# Patient Record
Sex: Female | Born: 1975 | Race: White | Hispanic: No | Marital: Single | Smoking: Current every day smoker
Health system: Southern US, Community
[De-identification: ages and names within clinical notes are randomized; demographics above are authoritative.]

## PROBLEM LIST (undated history)

## (undated) HISTORY — PX: TUBAL LIGATION: SHX77

## (undated) HISTORY — PX: CHOLECYSTECTOMY: SHX55

---

## 2015-01-31 ENCOUNTER — Emergency Department (INDEPENDENT_AMBULATORY_CARE_PROVIDER_SITE_OTHER)
Admission: EM | Admit: 2015-01-31 | Discharge: 2015-01-31 | Disposition: A | Discharge status: Home / Self Care | Payer: Self-pay | Source: Home / Self Care | Attending: Family Medicine | Admitting: Family Medicine

## 2015-01-31 ENCOUNTER — Emergency Department (INDEPENDENT_AMBULATORY_CARE_PROVIDER_SITE_OTHER): Payer: Self-pay

## 2015-01-31 ENCOUNTER — Encounter: Payer: Self-pay | Admitting: *Deleted

## 2015-01-31 DIAGNOSIS — R112 Nausea with vomiting, unspecified: Secondary | ICD-10-CM

## 2015-01-31 DIAGNOSIS — R1032 Left lower quadrant pain: Secondary | ICD-10-CM

## 2015-01-31 DIAGNOSIS — K219 Gastro-esophageal reflux disease without esophagitis: Secondary | ICD-10-CM

## 2015-01-31 LAB — POCT CBC W AUTO DIFF (K'VILLE URGENT CARE)

## 2015-01-31 LAB — POCT URINALYSIS DIP (MANUAL ENTRY)
Bilirubin, UA: NEGATIVE
Glucose, UA: NEGATIVE
Ketones, POC UA: NEGATIVE
Leukocytes, UA: NEGATIVE
Nitrite, UA: NEGATIVE
Protein Ur, POC: NEGATIVE
Spec Grav, UA: 1.015
Urobilinogen, UA: 0.2
pH, UA: 8

## 2015-01-31 LAB — COMPLETE METABOLIC PANEL WITH GFR
ALT: 19 U/L (ref 6–29)
AST: 17 U/L (ref 10–30)
Albumin: 4.3 g/dL (ref 3.6–5.1)
Alkaline Phosphatase: 61 U/L (ref 33–115)
BUN: 10 mg/dL (ref 7–25)
CO2: 25 mmol/L (ref 20–31)
Calcium: 9.5 mg/dL (ref 8.6–10.2)
Chloride: 104 mmol/L (ref 98–110)
Creat: 0.81 mg/dL (ref 0.50–1.10)
GFR, Est African American: >89 mL/min (ref >=60)
GFR, Est Non African American: >89 mL/min (ref >=60)
Glucose, Bld: 80 mg/dL (ref 65–99)
Potassium: 4.4 mmol/L (ref 3.5–5.3)
Sodium: 137 mmol/L (ref 135–146)
Total Bilirubin: 0.4 mg/dL (ref 0.2–1.2)
Total Protein: 7.1 g/dL (ref 6.1–8.1)

## 2015-01-31 LAB — POCT URINE PREGNANCY: Preg Test, Ur: NEGATIVE

## 2015-01-31 MED ORDER — ONDANSETRON 4 MG PO TBDP
4.0000 mg | ORAL_TABLET | Freq: Once | ORAL | Status: AC
  Administered 2015-01-31: 4 mg via ORAL

## 2015-01-31 MED ORDER — HYDROCODONE-ACETAMINOPHEN 5-325 MG PO TABS
1.0000 | ORAL_TABLET | Freq: Four times a day (QID) | ORAL | Status: AC | PRN
Start: 2015-01-31 — End: ?

## 2015-01-31 MED ORDER — KETOROLAC TROMETHAMINE 60 MG/2ML IM SOLN
60.0000 mg | Freq: Once | INTRAMUSCULAR | Status: AC
  Administered 2015-01-31: 60 mg via INTRAMUSCULAR

## 2015-01-31 MED ORDER — ONDANSETRON 4 MG PO TBDP: ORAL_TABLET | ORAL | Status: AC

## 2015-01-31 MED ORDER — AMOXICILLIN-POT CLAVULANATE 875-125 MG PO TABS: 1.0000 | ORAL_TABLET | Freq: Two times a day (BID) | ORAL | Status: AC

## 2015-01-31 MED ORDER — OMEPRAZOLE 20 MG PO CPDR: 20.0000 mg | DELAYED_RELEASE_CAPSULE | Freq: Every day | ORAL | Status: AC

## 2015-01-31 NOTE — ED Provider Notes (Signed)
CSN: [id number]     Arrival date & time 01/31/15  0919 History   First MD Initiated Contact with Patient 01/31/15 [phone]     Chief Complaint  Patient presents with  . Abdominal Pain   (Consider location/radiation/quality/duration/timing/severity/associated sxs/prior Treatment) HPI  Pt is a 39yo female presenting to [facility] with c/o LLQ abdominal pain that started last week.  Pain is sharp in nature. It was intermittent but within the last few days it has become more constant.  Pain is dull then sharp at times, 8/10 at worst. Pain is worse with movement.  Associated nausea and vomiting. Pt states the vomit is yellow and foam like "looks like mucous."  Denies change in bowel movements. Denies urinary or vaginal symptoms. Denies fever or chills. Pt reports having her gallbladder removed but she still has her appendix.  Pt states it also feel like she has been having worsening acid reflux after vomiting.  She is not on any antiacid medication.    History reviewed. No pertinent past medical history. Past Surgical History  Procedure Laterality Date  . Cholecystectomy    . Tubal ligation     Family History  Problem Relation Age of Onset  . COPD Mother   . Emphysema Mother   . COPD Father   . Emphysema Father    Social History  Substance Use Topics  . Smoking status: Current Every Day Smoker -- 1.00 packs/day    Types: Cigarettes  . Smokeless tobacco: Never Used  . Alcohol Use: Yes   OB History    No data available     Review of Systems  Constitutional: Negative for fever and chills.  HENT: Negative for congestion, ear pain, sore throat, trouble swallowing and voice change.   Respiratory: Negative for cough and shortness of breath.   Cardiovascular: Negative for chest pain and palpitations.  Gastrointestinal: Positive for nausea, vomiting and abdominal pain. Negative for diarrhea.  Genitourinary: Negative for dysuria, urgency, frequency, hematuria, flank pain, decreased urine volume,  vaginal bleeding, vaginal discharge, vaginal pain, menstrual problem and pelvic pain.  Musculoskeletal: Negative for myalgias, back pain and arthralgias.  Skin: Negative for rash.  All other systems reviewed and are negative.   Allergies  Review of patient's allergies indicates no known allergies.  Home Medications   Prior to Admission medications   Medication Sig Start Date End Date Taking? Authorizing Provider  albuterol (PROVENTIL HFA;VENTOLIN HFA) 108 (90 BASE) MCG/ACT inhaler Inhale 2 puffs into the lungs every 6 (six) hours as needed for wheezing or shortness of breath.   Yes Historical Provider, MD  LORazepam (ATIVAN) 1 MG tablet Take 1 mg by mouth at bedtime.   Yes Historical Provider, MD  amoxicillin-clavulanate (AUGMENTIN) 875-125 MG tablet Take 1 tablet by mouth 2 (two) times daily. One po bid x 10 days 01/31/15   Junius Finner, PA-C  HYDROcodone-acetaminophen (NORCO/VICODIN) 5-325 MG tablet Take 1-2 tablets by mouth every 6 (six) hours as needed for moderate pain or severe pain. 01/31/15   Junius Finner, PA-C  omeprazole (PRILOSEC) 20 MG capsule Take 1 capsule (20 mg total) by mouth daily. 01/31/15   Junius Finner, PA-C  ondansetron (ZOFRAN ODT) 4 MG disintegrating tablet  ODT q4 hours prn nausea/vomit 01/31/15   Junius Finner, PA-C   Meds Ordered and Administered this Visit   Medications  ondansetron (ZOFRAN-ODT) disintegrating tablet 4 mg (4 mg Oral Given 01/31/15 1028)  ketorolac (TORADOL) injection 60 mg (60 mg Intramuscular Given 01/31/15 1028)    BP 120/75 mmHg  Pulse 85  Temp(Src) 98.2 F (36.8 C) (Oral)  Ht 5\' 7"  (1.702 m)  Wt 181 lb (82.101 kg)  BMI 28.34 kg/m2  SpO2 99%  LMP 01/17/2015 No data found.   Physical Exam  Constitutional: She appears well-developed and well-nourished. No distress.  Pt sitting on exam table, appears mildly uncomfortable but no distress.  HENT:  Head: Normocephalic and atraumatic.  Mouth/Throat: Oropharynx is clear and  moist.  Eyes: Conjunctivae are normal. No scleral icterus.  Neck: Normal range of motion. Neck supple.  Cardiovascular: Normal rate, regular rhythm and normal heart sounds.   Pulmonary/Chest: Effort normal and breath sounds normal. No respiratory distress. She has no wheezes. She has no rales. She exhibits no tenderness.  Abdominal: Soft. Bowel sounds are normal. She exhibits no distension and no mass. There is tenderness. There is no rebound and no guarding.  Soft, non-distended. Tenderness in LLQ. No rebound or guarding. No CVAT  Musculoskeletal: Normal range of motion.  Neurological: She is alert.  Skin: Skin is warm and dry. She is not diaphoretic.  Nursing note and vitals reviewed.   ED Course  Procedures (including critical care time)  Labs Review Labs Reviewed  POCT URINALYSIS DIP (MANUAL ENTRY) - Abnormal; Notable for the following:    Clarity, UA cloudy (*)    Blood, UA trace-intact (*)    All other components within normal limits  COMPLETE METABOLIC PANEL WITH GFR   Narrative:    Performed at:  [facility]                [address], [address]                [place], [place] [address]  POCT CBC W AUTO DIFF ([facility])  POCT URINE PREGNANCY    Imaging Review Dg Abd 2 Views  01/31/2015  CLINICAL DATA:  39 year old female with left abdominal pain for 1 week with nausea and vomiting. EXAM: ABDOMEN - 2 VIEW COMPARISON:  None. FINDINGS: The bowel gas pattern is unremarkable. There is no evidence of bowel obstruction or pneumoperitoneum. Cholecystectomy and tubal ligation clips are noted. No suspicious calcifications identified. No acute bony abnormalities are present. IMPRESSION: No acute abnormalities.  Unremarkable bowel gas pattern. Electronically Signed   By: Harmon PierJeffrey  Hu M.D.   On: 01/31/2015 10:54     MDM   1. LLQ abdominal pain   2. Non-intractable vomiting with nausea, unspecified vomiting type   3. Gastroesophageal reflux disease, esophagitis  presence not specified    Pt c/o LLQ abdominal pain, nausea and vomiting.  Pt also c/o worsening acid reflux.   Pt appears well, non-toxic, is afebrile. Pt is tender in LLQ but no rebound, guarding or masses palpated.  No tenderness in RLQ.    CBC: unremarkable  UA: unremarkable, negative urine pregnancy  CMP: sent to lab  Abd plain films: no signs of bowel obstruction or pneumoperitoneum  Discussed labs and imaging with pt.  Symptoms most likely due to diverticulitis.  Suggested empiric treatment with antibiotics as well as nausea and pain medication.  Will add omeprazole for reported acid reflux.  Advised pt a definite dx of diverticulitis could only be made via CT scan which is unavailable at this facility at this time, however, she could go to emergency department for further imaging.   Pt agreed to try empiric treatment.  Pt appears well, no vomiting while she was in UC.   Discussed symptoms that warrant emergent care in the ED.  F/u with PCP in 3-4 days if not improving. Patient verbalized understanding and agreement with treatment plan.    Junius Finner, PA-C 02/01/15 [phone]

## 2015-01-31 NOTE — ED Notes (Signed)
Pt states she has had sharp abdominal pains on and off on lower left abdomen.  She states they are sharp then go to  Dull them she vomits and it is yellow, foamy, and like mucus. She states her BMs are the same consistency. Pain is worse with movement. Pain 8/10

## 2015-01-31 NOTE — Discharge Instructions (Signed)
Call 911 or go to closest ER if symptoms worsen including worsening abdominal pain, unable to keep down fluids, fevers, or other new concerning symptoms develop.

## 2015-02-01 ENCOUNTER — Telehealth: Payer: Self-pay | Admitting: *Deleted

## 2016-11-17 IMAGING — CR DG ABDOMEN 2V
2 series · 2 of 2 positions shown · non-contrast
Comparison: None.

CLINICAL DATA: 39-year-old female with left abdominal pain for 1
week with nausea and vomiting.

EXAM:
ABDOMEN - 2 VIEW

[abdomen erect]
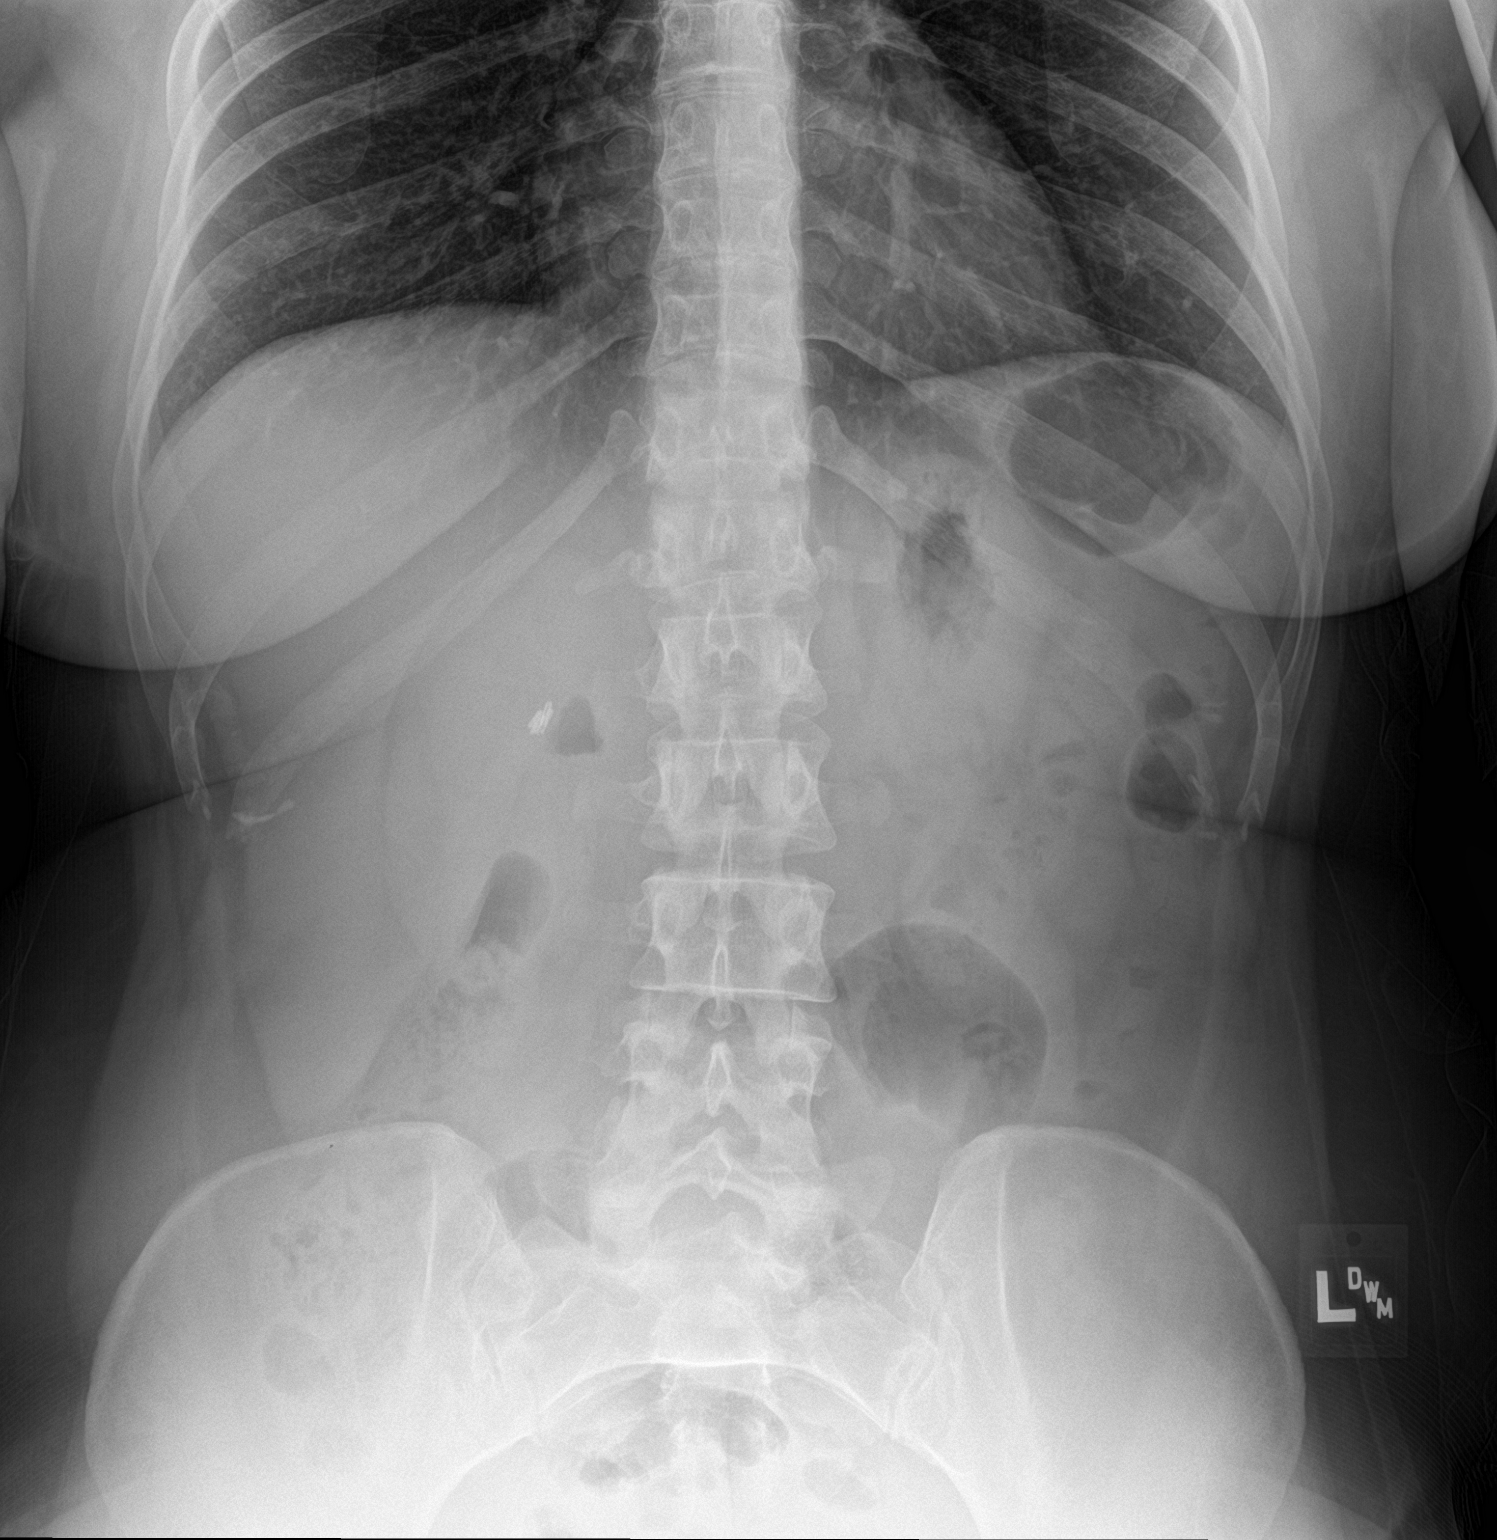

[abdomen supine]
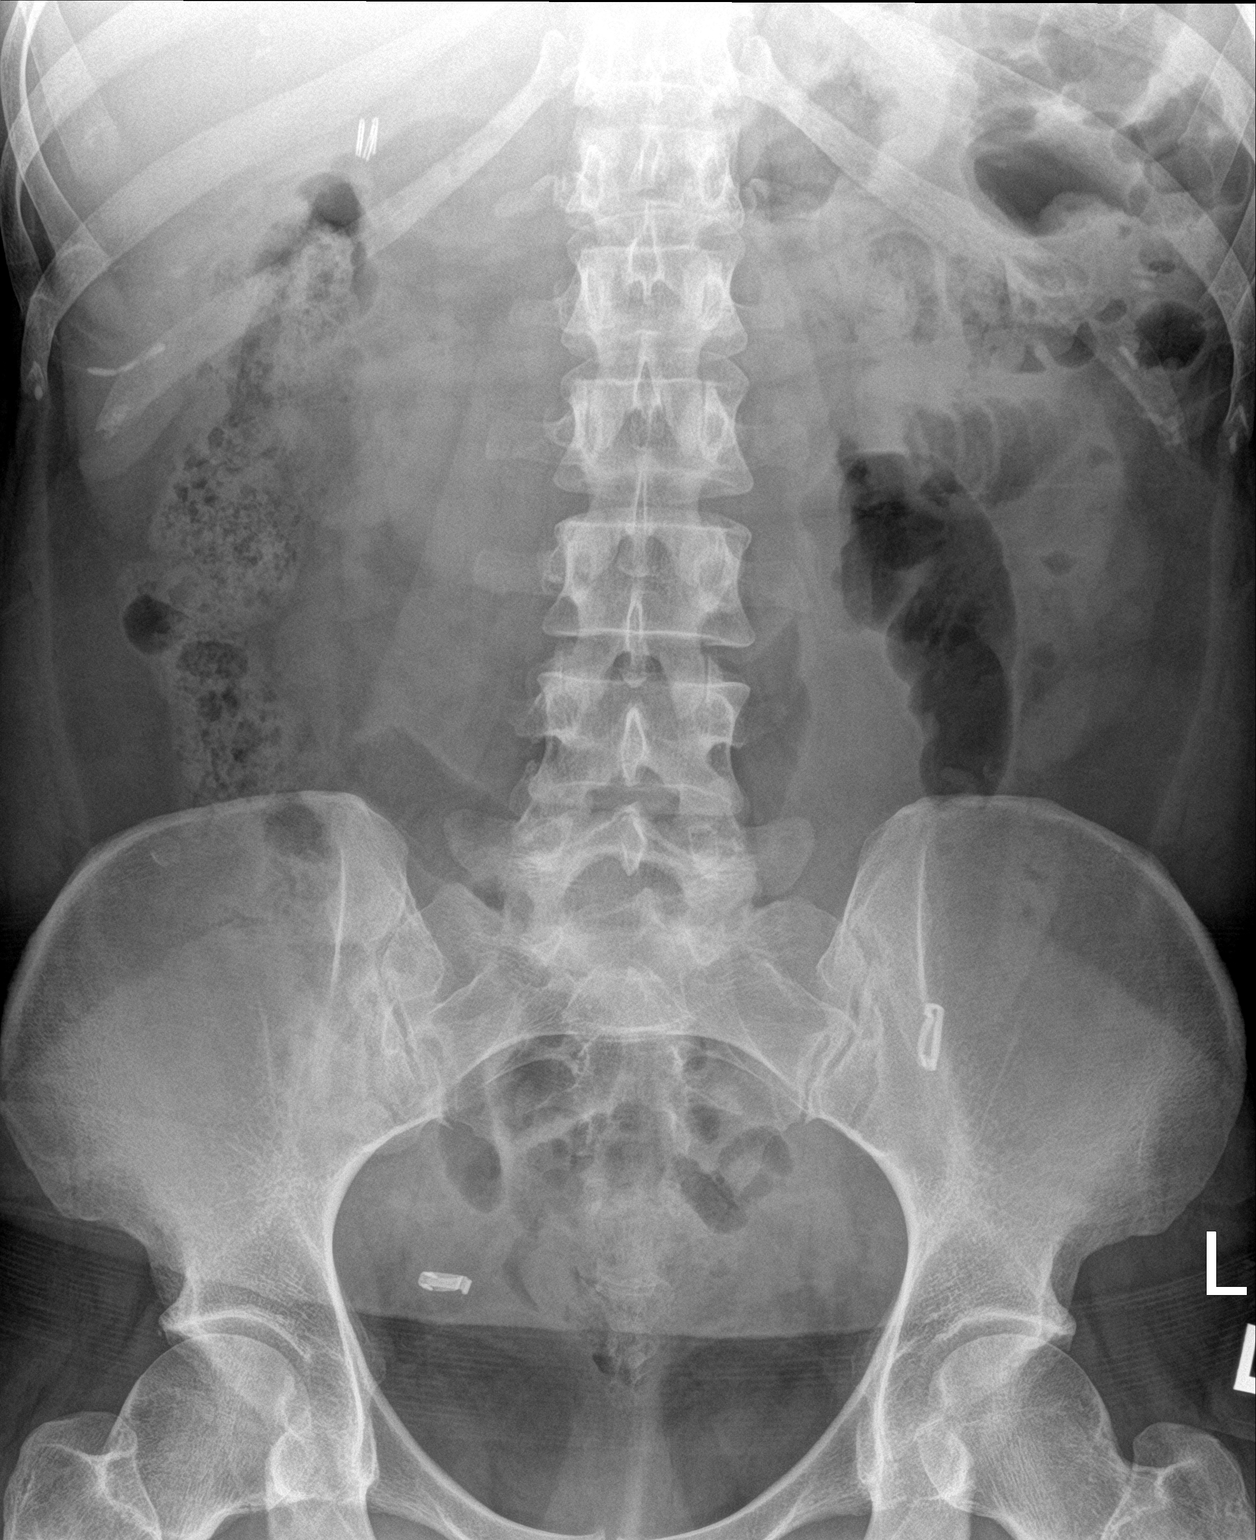

[2 of 2 positions shown; findings below may reference images not displayed]

FINDINGS: The bowel gas pattern is unremarkable.

There is no evidence of bowel obstruction or pneumoperitoneum.

Cholecystectomy and tubal ligation clips are noted.

No suspicious calcifications identified. No acute bony abnormalities
are present.
IMPRESSION: No acute abnormalities.  Unremarkable bowel gas pattern.
